# Patient Record
Sex: Female | Born: 1965 | Race: Black or African American | Hispanic: No | Marital: Married
Health system: Southern US, Community
[De-identification: ages and names within clinical notes are randomized; demographics above are authoritative.]

---

## 2018-12-18 ENCOUNTER — Ambulatory Visit
Admission: RE | Admit: 2018-12-18 | Discharge: 2018-12-18 | Disposition: A | Discharge status: Home / Self Care | Payer: BLUE CROSS/BLUE SHIELD | Source: Ambulatory Visit | Attending: Family Medicine | Admitting: Family Medicine

## 2018-12-18 ENCOUNTER — Other Ambulatory Visit: Payer: Self-pay | Admitting: Family Medicine

## 2018-12-18 DIAGNOSIS — M25562 Pain in left knee: Principal | ICD-10-CM

## 2018-12-18 DIAGNOSIS — G8929 Other chronic pain: Secondary | ICD-10-CM

## 2018-12-18 DIAGNOSIS — R269 Unspecified abnormalities of gait and mobility: Secondary | ICD-10-CM

## 2020-01-28 ENCOUNTER — Ambulatory Visit: Payer: Self-pay | Attending: Internal Medicine

## 2020-01-28 DIAGNOSIS — Z23 Encounter for immunization: Secondary | ICD-10-CM

## 2020-01-28 NOTE — Progress Notes (Signed)
   Covid-19 Vaccination Clinic  Name:  HELON WISINSKI    MRN: [id number] DOB: [birth year]  01/28/2020  Ms. Teffeteller was observed post Covid-19 immunization for 15 minutes without incident. She was provided with Vaccine Information Sheet and instruction to access the V-Safe system.   Ms. Wissinger was instructed to call 911 with any severe reactions post vaccine: Marland Kitchen Difficulty breathing  . Swelling of face and throat  . A fast heartbeat  . A bad rash all over body  . Dizziness and weakness   Immunizations Administered    Name Date Dose VIS Date Route   Pfizer COVID-19 Vaccine 01/28/2020  8:26 AM 0.3 mL 10/19/2019 Intramuscular   Manufacturer: [employer], [employer]   Lot: [id number]   NDC: [address]-4

## 2020-02-20 ENCOUNTER — Ambulatory Visit: Payer: Self-pay | Attending: Internal Medicine

## 2020-02-20 DIAGNOSIS — Z23 Encounter for immunization: Secondary | ICD-10-CM

## 2020-02-20 NOTE — Progress Notes (Signed)
   Covid-19 Vaccination Clinic  Name:  CLAUDE SWENDSEN    MRN: [id number] DOB: [birth year]  02/20/2020  Ms. Bundrick was observed post Covid-19 immunization for 15 minutes without incident. She was provided with Vaccine Information Sheet and instruction to access the V-Safe system.   Ms. Wehrenberg was instructed to call 911 with any severe reactions post vaccine: Marland Kitchen Difficulty breathing  . Swelling of face and throat  . A fast heartbeat  . A bad rash all over body  . Dizziness and weakness   Immunizations Administered    Name Date Dose VIS Date Route   Pfizer COVID-19 Vaccine 02/20/2020  9:33 AM 0.3 mL 10/19/2019 Intramuscular   Manufacturer: [employer], [employer]   Lot: [id number]   NDC: [address]-1

## 2020-09-05 IMAGING — DX DG KNEE COMPLETE 4+V*L*
4 series · 4 of 4 positions shown · non-contrast
Comparison: None.

CLINICAL DATA: 53-year-old female chronic anterior knee pain. No
known injury. Initial encounter.

EXAM:
LEFT KNEE - COMPLETE 4+ VIEW

[dg knee complete 4 views left (1 of 4)]
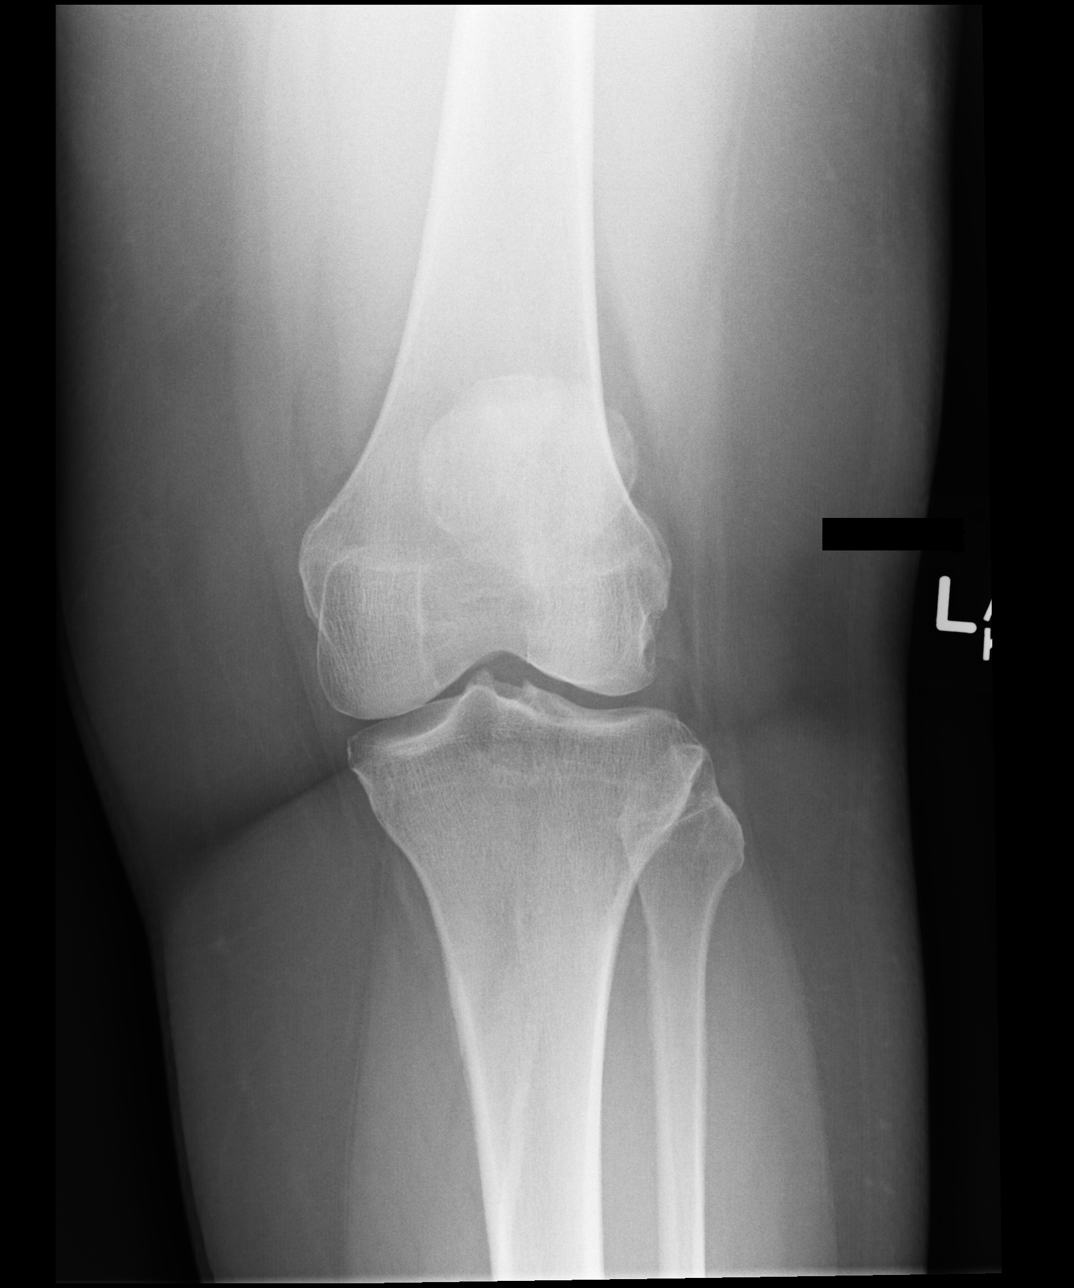

[dg knee complete 4 views left (2 of 4)]
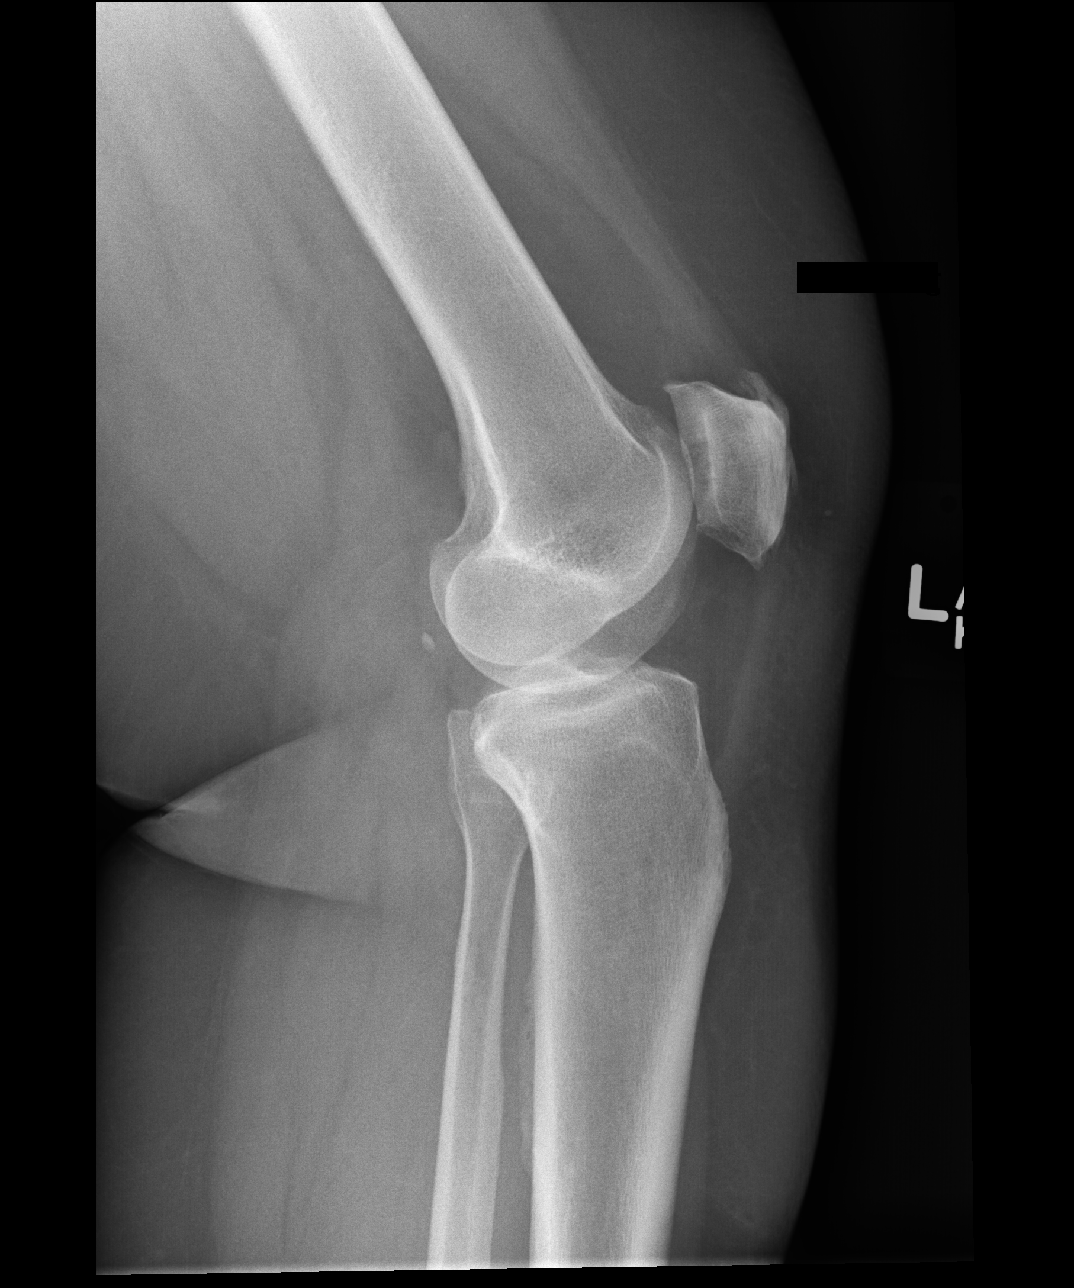

[dg knee complete 4 views left (3 of 4)]
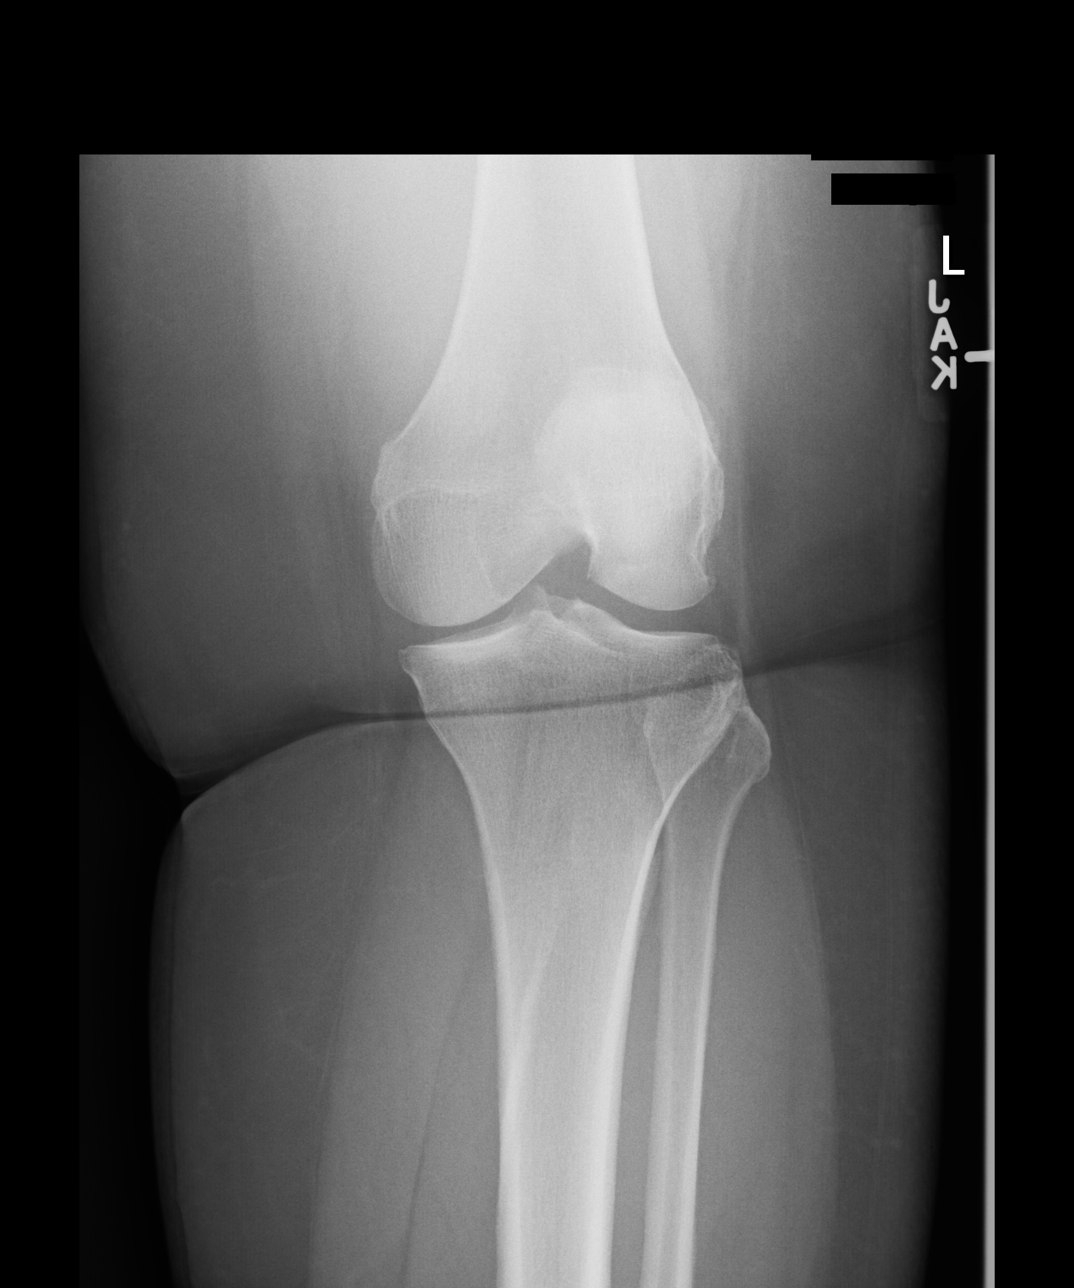

[dg knee complete 4 views left (4 of 4)]
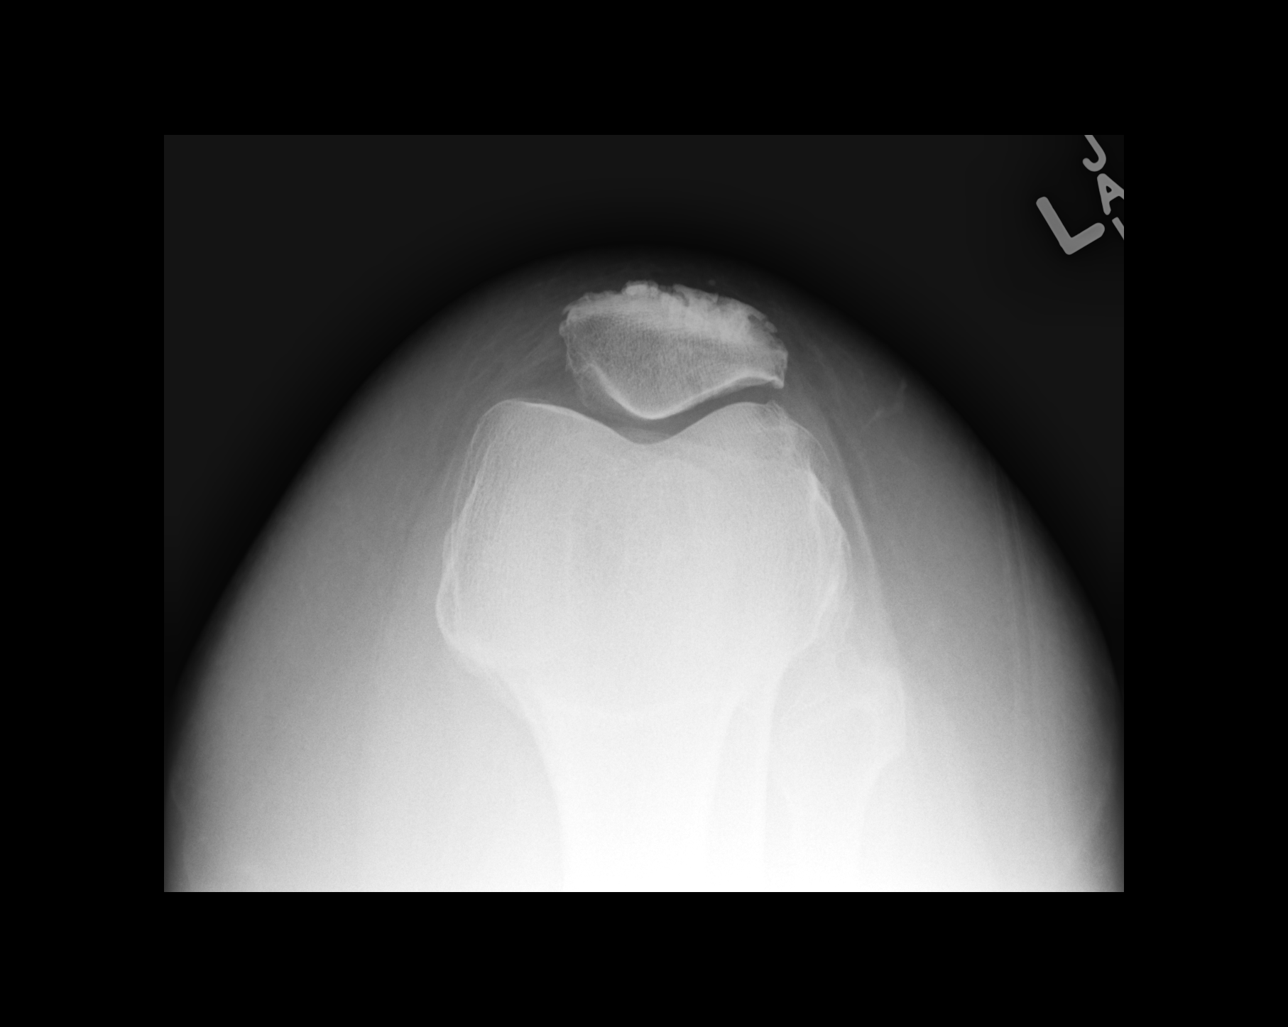

[4 of 4 positions shown; findings below may reference images not displayed]

FINDINGS: Mild patellofemoral joint degenerative changes greater laterally.
Prominent spur anterior superior aspect of patella at quadriceps
tendon insertion site. Small spur inferior aspect of patella.

Mild medial tibiofemoral joint space narrowing.

No fracture or dislocation.  No evidence of joint effusion
IMPRESSION: 1. Mild patellofemoral joint degenerative changes greater laterally.
Prominent spur anterior superior aspect of patella at quadriceps
tendon insertion site. Small spur inferior aspect of patella.
2. Mild medial tibiofemoral joint space narrowing.

## 2023-07-27 ENCOUNTER — Encounter: Payer: Self-pay | Admitting: Neurology

## 2023-07-27 ENCOUNTER — Other Ambulatory Visit: Payer: Self-pay

## 2023-07-27 DIAGNOSIS — R202 Paresthesia of skin: Secondary | ICD-10-CM

## 2023-08-08 ENCOUNTER — Other Ambulatory Visit (HOSPITAL_COMMUNITY): Payer: Self-pay | Admitting: Neurosurgery

## 2023-08-08 DIAGNOSIS — Z981 Arthrodesis status: Secondary | ICD-10-CM

## 2023-08-08 DIAGNOSIS — M545 Low back pain, unspecified: Secondary | ICD-10-CM

## 2023-08-18 ENCOUNTER — Ambulatory Visit (HOSPITAL_COMMUNITY)
Admission: RE | Admit: 2023-08-18 | Discharge: 2023-08-18 | Disposition: A | Discharge status: Home / Self Care | Payer: BLUE CROSS/BLUE SHIELD | Source: Ambulatory Visit | Attending: Neurosurgery | Admitting: Neurosurgery

## 2023-08-18 ENCOUNTER — Other Ambulatory Visit: Payer: Self-pay

## 2023-08-18 DIAGNOSIS — M545 Low back pain, unspecified: Secondary | ICD-10-CM | POA: Insufficient documentation

## 2023-08-18 DIAGNOSIS — M5416 Radiculopathy, lumbar region: Secondary | ICD-10-CM | POA: Diagnosis not present

## 2023-08-18 DIAGNOSIS — Z981 Arthrodesis status: Secondary | ICD-10-CM | POA: Diagnosis present

## 2023-08-18 DIAGNOSIS — M48061 Spinal stenosis, lumbar region without neurogenic claudication: Secondary | ICD-10-CM | POA: Insufficient documentation

## 2023-08-18 MED ORDER — IOHEXOL 180 MG/ML  SOLN
20.0000 mL | Freq: Once | INTRAMUSCULAR | Status: AC | PRN
  Administered 2023-08-18: 20 mL via INTRATHECAL

## 2023-08-18 MED ORDER — DIAZEPAM 5 MG PO TABS
ORAL_TABLET | ORAL | Status: AC
  Filled 2023-08-18: qty 1

## 2023-08-18 MED ORDER — DIAZEPAM 5 MG PO TABS
5.0000 mg | ORAL_TABLET | Freq: Once | ORAL | Status: AC
  Administered 2023-08-18: 5 mg via ORAL

## 2023-08-18 MED ORDER — ONDANSETRON HCL 4 MG/2ML IJ SOLN: 4.0000 mg | Freq: Four times a day (QID) | INTRAMUSCULAR | Status: DC | PRN

## 2023-08-18 MED ORDER — OXYCODONE HCL 5 MG PO TABS
5.0000 mg | ORAL_TABLET | ORAL | Status: DC | PRN
  Administered 2023-08-18: 5 mg via ORAL
  Filled 2023-08-18: qty 1

## 2023-08-18 MED ORDER — LIDOCAINE HCL (PF) 1 % IJ SOLN
5.0000 mL | Freq: Once | INTRAMUSCULAR | Status: AC
  Administered 2023-08-18: 5 mL via INTRADERMAL

## 2023-08-18 NOTE — Progress Notes (Signed)
Orders placed at 0727 for the myelogram. No orders were taken down from the signed and held category, no consent signed, no meds given, nothing at all was done. Unsure what else could have been done except place the orders and sign them. Will ask short stay why the orders were not released after they were placed and more than one half hour before the procedure time.

## 2023-08-18 NOTE — Progress Notes (Signed)
Dr Fabiola Backer notified  of no orders for pt. His response was "that's OK" and hung up phone. Will ask pre-procedure questions

## 2023-08-18 NOTE — Op Note (Signed)
*   No surgery found * Lumbar Myelogram  PATIENT:  Zoe Travis is a 57 y.o. female  PRE-OPERATIVE DIAGNOSIS:  lumbar radiculopathy  POST-OPERATIVE DIAGNOSIS:  same  PROCEDURE:  Lumbar Myelogram  SURGEON:  Bronsyn Shappell  ANESTHESIA:   local LOCAL MEDICATIONS USED:  LIDOCAINE  Procedure Note: SHANTI AGRESTI is a 57 y.o. female Was taken to the fluoroscopy suite and  positioned prone on the fluoroscopy table. Her back was prepared and draped in a sterile manner. I infiltrated 15 cc into the lumbar region. I then introduced a spinal needle into the thecal sac at the L5/S1 interlaminar space. I infiltrated 10cc of Isovue 180 into the thecal sac. Fluoroscopy showed the needle and contrast in the thecal sac. Loistine Eberlin Idler tolerated the procedure well. she Will be taken to CT for evaluation.     PATIENT DISPOSITION:  Short Stay

## 2023-09-01 ENCOUNTER — Telehealth: Payer: Self-pay | Admitting: Neurology

## 2023-09-01 NOTE — Telephone Encounter (Signed)
Patient called to make sure she didn't need a PA to get the EMG done

## 2023-09-01 NOTE — Telephone Encounter (Signed)
Called pt and let her know the CPT code for the EMG she has an appointment on Monday and wants to know how much she is going to have to pay.

## 2023-09-05 ENCOUNTER — Encounter: Payer: BLUE CROSS/BLUE SHIELD | Admitting: Neurology
